# Patient Record
Sex: Male | Born: 1985 | Race: Black or African American | Hispanic: No | Marital: Single | Smoking: Never smoker
Health system: Southern US, Community
[De-identification: ages and names within clinical notes are randomized; demographics above are authoritative.]

## PROBLEM LIST (undated history)

## (undated) DIAGNOSIS — E669 Obesity, unspecified: Secondary | ICD-10-CM

---

## 2006-09-23 ENCOUNTER — Emergency Department (HOSPITAL_COMMUNITY): Admission: EM | Admit: 2006-09-23 | Discharge: 2006-09-23 | Payer: Self-pay | Admitting: Emergency Medicine

## 2006-09-25 ENCOUNTER — Emergency Department (HOSPITAL_COMMUNITY): Admission: EM | Admit: 2006-09-25 | Discharge: 2006-09-25 | Payer: Self-pay | Admitting: Emergency Medicine

## 2011-06-15 ENCOUNTER — Inpatient Hospital Stay (INDEPENDENT_AMBULATORY_CARE_PROVIDER_SITE_OTHER)
Admission: RE | Admit: 2011-06-15 | Discharge: 2011-06-15 | Disposition: A | Discharge status: Home / Self Care | Payer: Self-pay | Source: Ambulatory Visit | Attending: Emergency Medicine | Admitting: Emergency Medicine

## 2011-06-15 ENCOUNTER — Emergency Department (HOSPITAL_COMMUNITY)
Admission: EM | Admit: 2011-06-15 | Discharge: 2011-06-16 | Disposition: A | Discharge status: Home / Self Care | Payer: Self-pay | Attending: Emergency Medicine | Admitting: Emergency Medicine

## 2011-06-15 DIAGNOSIS — I200 Unstable angina: Secondary | ICD-10-CM

## 2011-06-15 DIAGNOSIS — J40 Bronchitis, not specified as acute or chronic: Secondary | ICD-10-CM | POA: Insufficient documentation

## 2011-06-15 DIAGNOSIS — R059 Cough, unspecified: Secondary | ICD-10-CM | POA: Insufficient documentation

## 2011-06-15 DIAGNOSIS — R072 Precordial pain: Secondary | ICD-10-CM | POA: Insufficient documentation

## 2011-06-15 DIAGNOSIS — R05 Cough: Secondary | ICD-10-CM | POA: Insufficient documentation

## 2011-06-16 ENCOUNTER — Emergency Department (HOSPITAL_COMMUNITY): Payer: Self-pay

## 2015-03-22 ENCOUNTER — Emergency Department (HOSPITAL_COMMUNITY)
Admission: EM | Admit: 2015-03-22 | Discharge: 2015-03-22 | Disposition: A | Discharge status: Home / Self Care | Payer: Self-pay | Attending: Emergency Medicine | Admitting: Emergency Medicine

## 2015-03-22 ENCOUNTER — Encounter (HOSPITAL_COMMUNITY): Payer: Self-pay | Admitting: Emergency Medicine

## 2015-03-22 DIAGNOSIS — K0889 Other specified disorders of teeth and supporting structures: Secondary | ICD-10-CM

## 2015-03-22 DIAGNOSIS — K088 Other specified disorders of teeth and supporting structures: Secondary | ICD-10-CM | POA: Insufficient documentation

## 2015-03-22 DIAGNOSIS — K029 Dental caries, unspecified: Secondary | ICD-10-CM | POA: Insufficient documentation

## 2015-03-22 DIAGNOSIS — K002 Abnormalities of size and form of teeth: Secondary | ICD-10-CM | POA: Insufficient documentation

## 2015-03-22 MED ORDER — LIDOCAINE VISCOUS 2 % MT SOLN
15.0000 mL | Freq: Once | OROMUCOSAL | Status: AC
  Administered 2015-03-22: 15 mL via OROMUCOSAL
  Filled 2015-03-22: qty 15

## 2015-03-22 MED ORDER — AMOXICILLIN 500 MG PO CAPS: 500.0000 mg | ORAL_CAPSULE | Freq: Three times a day (TID) | ORAL | Status: AC

## 2015-03-22 MED ORDER — LIDOCAINE VISCOUS 2 % MT SOLN: 20.0000 mL | OROMUCOSAL | Status: AC | PRN

## 2015-03-22 MED ORDER — TRAMADOL-ACETAMINOPHEN 37.5-325 MG PO TABS: 1.0000 | ORAL_TABLET | Freq: Four times a day (QID) | ORAL | Status: AC | PRN

## 2015-03-22 NOTE — ED Provider Notes (Signed)
CSN: [id number]     Arrival date & time 03/22/15  0341 History   None    Chief Complaint  Patient presents with  . Dental Pain     (Consider location/radiation/quality/duration/timing/severity/associated sxs/prior Treatment) HPI Comments: Pt arrives with L upper jaw/dental pain ongoing for the last two days, states it's only when he lays down. 9/10 when laying down.  Patient is a 29 y.o. male presenting with tooth pain.  Dental Pain Location:  Upper Quality:  Throbbing Severity:  Moderate Onset quality:  Sudden Duration:  2 days Timing:  Constant Progression:  Worsening Chronicity:  New Context: poor dentition   Context: not abscess, not dental fracture, not recent dental surgery and not trauma   Relieved by:  Nothing Worsened by:  Touching Associated symptoms: no facial pain, no facial swelling, no fever and no gum swelling   Risk factors: lack of dental care   Risk factors: no diabetes     History reviewed. No pertinent past medical history. History reviewed. No pertinent past surgical history. No family history on file. History  Substance Use Topics  . Smoking status: Never Smoker   . Smokeless tobacco: Not on file  . Alcohol Use: No    Review of Systems  Constitutional: Negative for fever.  HENT: Positive for dental problem. Negative for facial swelling.   All other systems reviewed and are negative.     Allergies  Review of patient's allergies indicates no known allergies.  Home Medications   Prior to Admission medications   Medication Sig Start Date End Date Taking? Authorizing Provider  amoxicillin (AMOXIL) 500 MG capsule Take 1 capsule (500 mg total) by mouth 3 (three) times daily. 03/22/15   Jarissa Sheriff, PA-C  lidocaine (XYLOCAINE) 2 % solution Use as directed 20 mLs in the mouth or throat as needed for mouth pain. 03/22/15   Sada Mazzoni, PA-C  traMADol-acetaminophen (ULTRACET) 37.5-325 MG per tablet Take 1 tablet by mouth every 6  (six) hours as needed. 03/22/15   Laiana Fratus, PA-C   BP 158/86 mmHg  Pulse 92  Temp(Src) 98.1 F (36.7 C)  Resp 16  Ht 6\' 1"  (1.854 m)  Wt 305 lb (138.347 kg)  BMI 40.25 kg/m2  SpO2 100% Physical Exam  Constitutional: He is oriented to person, place, and time. He appears well-developed and well-nourished. No distress.  HENT:  Head: Normocephalic and atraumatic.  Right Ear: External ear normal.  Left Ear: External ear normal.  Nose: Nose normal.  Mouth/Throat: Uvula is midline, oropharynx is clear and moist and mucous membranes are normal. No oral lesions. No trismus in the jaw. Abnormal dentition. Dental caries present. No dental abscesses, uvula swelling or lacerations.    Submental and sublingual spaces are soft.  Eyes: Conjunctivae are normal.  Neck: Normal range of motion. Neck supple.  Cardiovascular: Normal rate, regular rhythm and normal heart sounds.   Pulmonary/Chest: Effort normal and breath sounds normal. No respiratory distress.  Abdominal: There is no tenderness.  Neurological: He is alert and oriented to person, place, and time.  Skin: Skin is warm and dry. He is not diaphoretic.  Psychiatric: He has a normal mood and affect.  Nursing note and vitals reviewed.   ED Course  Procedures (including critical care time) Medications  lidocaine (XYLOCAINE) 2 % viscous mouth solution 15 mL (15 mLs Mouth/Throat Given 03/22/15 0417)    Labs Review Labs Reviewed - No data to display  Imaging Review No results found.   EKG Interpretation None  MDM   Final diagnoses:  Pain, dental    Filed Vitals:   03/22/15 0400  BP: 158/86  Pulse: 92  Temp:   Resp: 16   Afebrile, NAD, non-toxic appearing, AAOx4.  Patient with toothache.  No gross abscess.  Exam unconcerning for Ludwig's angina or spread of infection.  Will treat with Amoxil and pain medicine.  Urged patient to follow-up with dentist.  Advised PCP recheck of BP. No signs of HTN emergency or  urgency. Return precautions discussed. Patient is agreeable to plan. Patient is stable at time of discharge      Francee PiccoloJennifer Kemet Nijjar, PA-C 03/22/15 [id number]  Loren Raceravid Yelverton, MD 03/22/15 [phone]

## 2015-03-22 NOTE — ED Notes (Signed)
Pt. Left with all belongings and refused wheelchair 

## 2015-03-22 NOTE — ED Notes (Signed)
Pt arrives with L upper jaw/dental pain ongoing for the last two days, states it's only when he lays down. 9/10 when laying down. Ibuprofen effective when sitting up. No dentist.

## 2015-03-22 NOTE — Discharge Instructions (Signed)
Please follow up with your primary care physician in 1-2 days. If you do not have one please call the [facility] number listed above. Please follow up with Dr. Leanord AsalFarless to schedule a follow up appointment.  Please take your antibiotic until completion. Please take pain medication and/or muscle relaxants as prescribed and as needed for pain. Please do not drive on narcotic pain medication or on muscle relaxants. Please read all discharge instructions and return precautions.    Dental Pain A tooth ache may be caused by cavities (tooth decay). Cavities expose the nerve of the tooth to air and hot or cold temperatures. It may come from an infection or abscess (also called a boil or furuncle) around your tooth. It is also often caused by dental caries (tooth decay). This causes the pain you are having. DIAGNOSIS  Your caregiver can diagnose this problem by exam. TREATMENT   If caused by an infection, it may be treated with medications which kill germs (antibiotics) and pain medications as prescribed by your caregiver. Take medications as directed.  Only take over-the-counter or prescription medicines for pain, discomfort, or fever as directed by your caregiver.  Whether the tooth ache today is caused by infection or dental disease, you should see your dentist as soon as possible for further care. SEEK MEDICAL CARE IF: The exam and treatment you received today has been provided on an emergency basis only. This is not a substitute for complete medical or dental care. If your problem worsens or new problems (symptoms) appear, and you are unable to meet with your dentist, call or return to this location. SEEK IMMEDIATE MEDICAL CARE IF:   You have a fever.  You develop redness and swelling of your face, jaw, or neck.  You are unable to open your mouth.  You have severe pain uncontrolled by pain medicine. MAKE SURE YOU:   Understand these instructions.  Will watch your  condition.  Will get help right away if you are not doing well or get worse. Document Released: 10/26/2005 Document Revised: 01/18/2012 Document Reviewed: 06/13/2008 [facility] Patient Information 2015 [place] [place]. This information is not intended to replace advice given to you by your health care provider. Make sure you discuss any questions you have with your health care provider.

## 2017-05-26 ENCOUNTER — Emergency Department (HOSPITAL_COMMUNITY): Payer: Self-pay

## 2017-05-26 ENCOUNTER — Ambulatory Visit (HOSPITAL_COMMUNITY)
Admission: EM | Admit: 2017-05-26 | Discharge: 2017-05-26 | Disposition: A | Discharge status: Home / Self Care | Payer: Self-pay

## 2017-05-26 ENCOUNTER — Encounter (HOSPITAL_COMMUNITY): Payer: Self-pay | Admitting: *Deleted

## 2017-05-26 ENCOUNTER — Emergency Department (HOSPITAL_COMMUNITY)
Admission: EM | Admit: 2017-05-26 | Discharge: 2017-05-26 | Disposition: A | Discharge status: Home / Self Care | Payer: Self-pay | Attending: Emergency Medicine | Admitting: Emergency Medicine

## 2017-05-26 DIAGNOSIS — R42 Dizziness and giddiness: Secondary | ICD-10-CM | POA: Insufficient documentation

## 2017-05-26 DIAGNOSIS — R0789 Other chest pain: Secondary | ICD-10-CM | POA: Insufficient documentation

## 2017-05-26 HISTORY — DX: Obesity, unspecified: E66.9

## 2017-05-26 LAB — CBC
HCT: 45.4 % (ref 39.0–52.0)
Hemoglobin: 14.6 g/dL (ref 13.0–17.0)
MCH: 26.8 pg (ref 26.0–34.0)
MCHC: 32.2 g/dL (ref 30.0–36.0)
MCV: 83.3 fL (ref 78.0–100.0)
Platelets: 197 10*3/uL (ref 150–400)
RBC: 5.45 MIL/uL (ref 4.22–5.81)
RDW: 14.6 % (ref 11.5–15.5)
WBC: 7.6 10*3/uL (ref 4.0–10.5)

## 2017-05-26 LAB — I-STAT TROPONIN, ED: Troponin i, poc: 0 ng/mL (ref 0.00–0.08)

## 2017-05-26 LAB — BASIC METABOLIC PANEL
Anion gap: 6 (ref 5–15)
BUN: 13 mg/dL (ref 6–20)
CO2: 25 mmol/L (ref 22–32)
Calcium: 8.9 mg/dL (ref 8.9–10.3)
Chloride: 106 mmol/L (ref 101–111)
Creatinine, Ser: 1.19 mg/dL (ref 0.61–1.24)
GFR calc Af Amer: >60 mL/min (ref >60)
GFR calc non Af Amer: >60 mL/min (ref >60)
Glucose, Bld: 117 mg/dL — ABNORMAL HIGH (ref 65–99)
Potassium: 4.3 mmol/L (ref 3.5–5.1)
Sodium: 137 mmol/L (ref 135–145)

## 2017-05-26 NOTE — ED Provider Notes (Signed)
[facility]-EMERGENCY DEPT Provider Note   CSN: [id number] Arrival date & time: 05/26/17  1224     History   Chief Complaint Chief Complaint  Patient presents with  . Chest Pain    HPI Austin Ellison is a 31 y.o. male.  HPI   31 year old male presents today with complaints of chest pain.  Patient reports symptoms started yesterday in the left side of his chest.  He reports this was a sharp sensation that started without provocation.  Patient notes this lasted approximately 20 minutes and resolved on its own.  He reports another episode that lasts approximately 5 minutes again resolving without intervention.  He reports symptoms were not made worse with deep inspiration or movement.  He denies any preceding physical activity.  He denies any associated shortness of breath.  He reports he was slightly dizzy when the pain was ongoing.  He denies any ingestion or abdominal pain.  He denies any significant risk factors of PE or DVT.  He denies any significant personal cardiac history, reporting that his grandmother had an MI in her late 55s.  Patient is a non-smoker, denies any drug use, very little caffeine intake.  Patient denies any upper respiratory complaints, or any infectious etiology.   Past Medical History:  Diagnosis Date  . Obesity     There are no active problems to display for this patient.   History reviewed. No pertinent surgical history.     Home Medications    Prior to Admission medications   Medication Sig Start Date End Date Taking? Authorizing Provider  amoxicillin (AMOXIL) 500 MG capsule Take 1 capsule (500 mg total) by mouth 3 (three) times daily. 03/22/15   Piepenbrink, Victorino Dike, PA-C  lidocaine (XYLOCAINE) 2 % solution Use as directed 20 mLs in the mouth or throat as needed for mouth pain. 03/22/15   Piepenbrink, Victorino Dike, PA-C  traMADol-acetaminophen (ULTRACET) 37.5-325 MG per tablet Take 1 tablet by mouth every 6 (six) hours as needed. 03/22/15   Piepenbrink,  Victorino Dike, PA-C    Family History History reviewed. No pertinent family history.  Social History Social History  Substance Use Topics  . Smoking status: Never Smoker  . Smokeless tobacco: Not on file  . Alcohol use No     Allergies   Patient has no known allergies.   Review of Systems Review of Systems  All other systems reviewed and are negative.    Physical Exam Updated Vital Signs BP (!) 162/113   Pulse 82   Temp 98.4 F (36.9 C) (Oral)   Resp 18   Ht 6' (1.829 m)   Wt 133.8 kg (295 lb)   SpO2 98%   BMI 40.01 kg/m   Physical Exam  Constitutional: He is oriented to person, place, and time. He appears well-developed and well-nourished.  HENT:  Head: Normocephalic and atraumatic.  Eyes: Pupils are equal, round, and reactive to light. Conjunctivae are normal. Right eye exhibits no discharge. Left eye exhibits no discharge. No scleral icterus.  Neck: Normal range of motion. No JVD present. No tracheal deviation present.  Cardiovascular: Normal rate, regular rhythm, normal heart sounds and intact distal pulses.  Exam reveals no gallop and no friction rub.   No murmur heard. Pulmonary/Chest: Effort normal. No stridor. He exhibits tenderness.  Minor tenderness of the left anterior chest wall  Abdominal: Soft. He exhibits no distension and no mass. There is no tenderness. There is no rebound and no guarding.  Musculoskeletal: He exhibits no edema.  Neurological: He is  alert and oriented to person, place, and time. Coordination normal.  Psychiatric: He has a normal mood and affect. His behavior is normal. Judgment and thought content normal.  Nursing note and vitals reviewed.   ED Treatments / Results  Labs (all labs ordered are listed, but only abnormal results are displayed) Labs Reviewed  BASIC METABOLIC PANEL - Abnormal; Notable for the following:       Result Value   Glucose, Bld 117 (*)    All other components within normal limits  CBC  I-STAT TROPOININ,  ED    EKG  EKG Interpretation None       Radiology Dg Chest 2 View  Result Date: 05/26/2017 CLINICAL DATA:  Left-sided chest pain beginning yesterday. EXAM: CHEST  2 VIEW COMPARISON:  Two-view chest x-ray 06/16/2011 FINDINGS: The heart size is normal. The lungs are clear. There is no edema or effusion. The visualized soft tissues and bony thorax are unremarkable. IMPRESSION: Negative two view chest x-ray Electronically Signed   By: Marin Robertshristopher  Mattern M.D.   On: 05/26/2017 13:01    Procedures Procedures (including critical care time)  Medications Ordered in ED Medications - No data to display   Initial Impression / Assessment and Plan / ED Course  I have reviewed the triage vital signs and the nursing notes.  Pertinent labs & imaging results that were available during my care of the patient were reviewed by me and considered in my medical decision making (see chart for details).     Final Clinical Impressions(s) / ED Diagnoses   Final diagnoses:  Chest wall pain    31 year old male presents today with complaints of chest pain.  This is most likely chest wall pain.  It is reproducible on my exam.  He has very low heart score low suspicion for ACS, PERC negative.  Patient with no other complaints here today, in no acute distress.  Patient has reassuring workup.  He will follow-up as an outpatient with his primary care provider, he will return immediately if any new or worsening signs or symptoms present.  Patient verbalized understanding and agreement to today's plan had no further questions or concerns at time discharge.  New Prescriptions New Prescriptions   No medications on file     Rosalio LoudHedges, Adriann Ballweg, PA-C 05/26/17 [address][id number]    Mccabe Gloria, PA-C 05/26/17 1505    Raeford RazorKohut, Stephen, MD 06/05/17 [phone]

## 2017-05-26 NOTE — ED Triage Notes (Signed)
Pt reports onset yesterday of left side chest pain while working. Denies sob, denies pain increasing with movement, breathing or coughing. No acute distress is noted at triage and ekg done.

## 2017-05-26 NOTE — ED Notes (Signed)
I-stat troponin resulted 0.00 per Rulon EisenmengerFelix phelebotomy

## 2017-05-26 NOTE — Discharge Instructions (Signed)
Please read attached information. If you experience any new or worsening signs or symptoms please return to the emergency room for evaluation. Please follow-up with your primary care provider or specialist as discussed. Please use medication prescribed only as directed and discontinue taking if you have any concerning signs or symptoms.   °

## 2017-08-04 ENCOUNTER — Encounter (HOSPITAL_COMMUNITY): Payer: Self-pay | Admitting: Emergency Medicine

## 2017-08-04 ENCOUNTER — Emergency Department (HOSPITAL_COMMUNITY)
Admission: EM | Admit: 2017-08-04 | Discharge: 2017-08-04 | Disposition: A | Discharge status: Home / Self Care | Payer: Self-pay | Attending: Emergency Medicine | Admitting: Emergency Medicine

## 2017-08-04 DIAGNOSIS — R51 Headache: Secondary | ICD-10-CM | POA: Insufficient documentation

## 2017-08-04 DIAGNOSIS — Z5321 Procedure and treatment not carried out due to patient leaving prior to being seen by health care provider: Secondary | ICD-10-CM | POA: Insufficient documentation

## 2017-08-04 MED ORDER — OXYCODONE-ACETAMINOPHEN 5-325 MG PO TABS
ORAL_TABLET | ORAL | Status: AC
  Filled 2017-08-04: qty 1

## 2017-08-04 MED ORDER — OXYCODONE-ACETAMINOPHEN 5-325 MG PO TABS
1.0000 | ORAL_TABLET | ORAL | Status: DC | PRN
Start: 2017-08-04 — End: 2017-08-04
  Administered 2017-08-04: 1 via ORAL

## 2017-08-04 NOTE — ED Notes (Signed)
Patient called but no answer 

## 2017-08-04 NOTE — ED Triage Notes (Signed)
Pt c/o headache that started 3-4 days ago-- "entire head throbbing" no hx of migraines. No relief with advil or OTC meds.

## 2017-10-05 ENCOUNTER — Emergency Department (HOSPITAL_COMMUNITY): Payer: Self-pay

## 2017-10-05 ENCOUNTER — Other Ambulatory Visit: Payer: Self-pay

## 2017-10-05 ENCOUNTER — Encounter (HOSPITAL_COMMUNITY): Payer: Self-pay | Admitting: Neurology

## 2017-10-05 ENCOUNTER — Emergency Department (HOSPITAL_COMMUNITY)
Admission: EM | Admit: 2017-10-05 | Discharge: 2017-10-05 | Disposition: A | Discharge status: Home / Self Care | Payer: Self-pay | Attending: Emergency Medicine | Admitting: Emergency Medicine

## 2017-10-05 DIAGNOSIS — R51 Headache: Secondary | ICD-10-CM | POA: Insufficient documentation

## 2017-10-05 DIAGNOSIS — R519 Headache, unspecified: Secondary | ICD-10-CM

## 2017-10-05 LAB — CBC WITH DIFFERENTIAL/PLATELET
BASOS ABS: 0.1 10*3/uL (ref 0.0–0.1)
Basophils Relative: 1 %
EOS PCT: 3 %
Eosinophils Absolute: 0.2 10*3/uL (ref 0.0–0.7)
HCT: 44.7 % (ref 39.0–52.0)
Hemoglobin: 14.4 g/dL (ref 13.0–17.0)
LYMPHS PCT: 37 %
Lymphs Abs: 1.9 10*3/uL (ref 0.7–4.0)
MCH: 26.9 pg (ref 26.0–34.0)
MCHC: 32.2 g/dL (ref 30.0–36.0)
MCV: 83.6 fL (ref 78.0–100.0)
MONO ABS: 0.4 10*3/uL (ref 0.1–1.0)
MONOS PCT: 7 %
Neutro Abs: 2.8 10*3/uL (ref 1.7–7.7)
Neutrophils Relative %: 52 %
PLATELETS: 193 10*3/uL (ref 150–400)
RBC: 5.35 MIL/uL (ref 4.22–5.81)
RDW: 15.1 % (ref 11.5–15.5)
WBC: 5.3 10*3/uL (ref 4.0–10.5)

## 2017-10-05 LAB — I-STAT CHEM 8, ED
BUN: 16 mg/dL (ref 6–20)
CREATININE: 1 mg/dL (ref 0.61–1.24)
Calcium, Ion: 1.17 mmol/L (ref 1.15–1.40)
Chloride: 101 mmol/L (ref 101–111)
GLUCOSE: 90 mg/dL (ref 65–99)
HCT: 46 % (ref 39.0–52.0)
HEMOGLOBIN: 15.6 g/dL (ref 13.0–17.0)
POTASSIUM: 4.2 mmol/L (ref 3.5–5.1)
Sodium: 140 mmol/L (ref 135–145)
TCO2: 30 mmol/L (ref 22–32)

## 2017-10-05 MED ORDER — ASPIRIN-ACETAMINOPHEN-CAFFEINE 250-250-65 MG PO TABS: 1.0000 | ORAL_TABLET | Freq: Four times a day (QID) | ORAL | 0 refills | Status: AC | PRN

## 2017-10-05 MED ORDER — KETOROLAC TROMETHAMINE 60 MG/2ML IM SOLN
30.0000 mg | Freq: Once | INTRAMUSCULAR | Status: AC
  Administered 2017-10-05: 30 mg via INTRAMUSCULAR
  Filled 2017-10-05: qty 2

## 2017-10-05 MED ORDER — DEXAMETHASONE SODIUM PHOSPHATE 10 MG/ML IJ SOLN
10.0000 mg | Freq: Once | INTRAMUSCULAR | Status: AC
  Administered 2017-10-05: 10 mg via INTRAMUSCULAR
  Filled 2017-10-05: qty 1

## 2017-10-05 NOTE — Discharge Instructions (Signed)
Take exerine headache for your head pain. Drink plenty of fluids. Rest. Eat healthy. Get good sleep. Follow up with [facility] wellness center if not improving. Return if fever, blurred vision, numbness weakness in extremities, any new concerning symptom.

## 2017-10-05 NOTE — ED Triage Notes (Signed)
Pt here reporting intermittent h/a x 3 days. Denies hx of h/a. Denies n/v/d. Reports some dizziness. Ambulatory to triage. Took some tylenol this morning when he got off work, no relief. 9/10 h/a at current.

## 2017-10-05 NOTE — ED Notes (Signed)
Spoke with PA, will not order labs at this time.

## 2017-10-05 NOTE — ED Notes (Signed)
Pt went to CT

## 2017-10-05 NOTE — ED Provider Notes (Signed)
[facility] Provider Note   CSN: [id number] Arrival date & time: 10/05/17  1354     History   Chief Complaint Chief Complaint  Patient presents with  . Headache    HPI Austin Ellison is a 31 y.o. male.  HPI Austin Ellison is a 31 y.o. male presents to emergency department complaining of a headache.  Patient states his headache started 3 days ago.  He reports associated dizziness.  Denies any nausea or vomiting.  No photophobia.  He states pain is all over, from his forehead all the way to the back of the head.  States nothing is making symptoms better or worse.  Onset of headache is gradual.  No numbness or weakness in extremities.  Denies any head injury.  No fever or chills.  No neck pain or stiffness.  No difficulty speaking or ambulating.  History of similar headache once in the past, did not seek any attention.  He states this headache comes and goes in severity.  It does get better with Tylenol, but it quickly comes back once it wears out.  Patient drove himself here.  Past Medical History:  Diagnosis Date  . Obesity     There are no active problems to display for this patient.   History reviewed. No pertinent surgical history.     Home Medications    Prior to Admission medications   Medication Sig Start Date End Date Taking? Authorizing Provider  amoxicillin (AMOXIL) 500 MG capsule Take 1 capsule (500 mg total) by mouth 3 (three) times daily. 03/22/15   Piepenbrink, Victorino DikeJennifer, PA-C  lidocaine (XYLOCAINE) 2 % solution Use as directed 20 mLs in the mouth or throat as needed for mouth pain. 03/22/15   Piepenbrink, Victorino DikeJennifer, PA-C  traMADol-acetaminophen (ULTRACET) 37.5-325 MG per tablet Take 1 tablet by mouth every 6 (six) hours as needed. 03/22/15   Piepenbrink, Victorino DikeJennifer, PA-C    Family History No family history on file.  Social History Social History   Tobacco Use  . Smoking status: Never Smoker  . Smokeless tobacco: Never  Used  Substance Use Topics  . Alcohol use: No  . Drug use: No     Allergies   Patient has no known allergies.   Review of Systems Review of Systems  Constitutional: Negative for chills and fever.  Respiratory: Negative for cough, chest tightness and shortness of breath.   Cardiovascular: Negative for chest pain, palpitations and leg swelling.  Gastrointestinal: Negative for abdominal distention, abdominal pain, diarrhea, nausea and vomiting.  Genitourinary: Negative for dysuria, frequency, hematuria and urgency.  Musculoskeletal: Negative for arthralgias, myalgias, neck pain and neck stiffness.  Skin: Negative for rash.  Allergic/Immunologic: Negative for immunocompromised state.  Neurological: Positive for dizziness, light-headedness and headaches. Negative for weakness and numbness.  All other systems reviewed and are negative.    Physical Exam Updated Vital Signs BP (!) 159/86 (BP Location: Right Arm)   Pulse 66   Temp 97.8 F (36.6 C) (Oral)   Resp 18   Ht 6\' 1"  (1.854 m)   Wt 133.8 kg (295 lb)   SpO2 99%   BMI 38.92 kg/m   Physical Exam  Constitutional: He is oriented to person, place, and time. He appears well-developed and well-nourished. No distress.  HENT:  Head: Normocephalic and atraumatic.  Eyes: Conjunctivae and EOM are normal. Pupils are equal, round, and reactive to light.  Neck: Normal range of motion. Neck supple.  Cardiovascular: Normal rate, regular rhythm and normal heart  sounds.  Pulmonary/Chest: Effort normal. No respiratory distress. He has no wheezes. He has no rales.  Abdominal: Soft. Bowel sounds are normal. He exhibits no distension. There is no tenderness. There is no rebound.  Musculoskeletal: He exhibits no edema.  Neurological: He is alert and oriented to person, place, and time. He has normal strength.  5/5 and equal upper and lower extremity strength bilaterally. Equal grip strength bilaterally. Normal finger to nose and heel to shin.  No pronator drift.   Skin: Skin is warm and dry.  Nursing note and vitals reviewed.    ED Treatments / Results  Labs (all labs ordered are listed, but only abnormal results are displayed) Labs Reviewed  CBC WITH DIFFERENTIAL/PLATELET  I-STAT CHEM 8, ED    EKG  EKG Interpretation None       Radiology Ct Head Wo Contrast  Result Date: 10/05/2017 CLINICAL DATA:  Initial evaluation for acute headache for 3-4 days. EXAM: CT HEAD WITHOUT CONTRAST TECHNIQUE: Contiguous axial images were obtained from the base of the skull through the vertex without intravenous contrast. COMPARISON:  None. FINDINGS: Brain: Cerebral volume within normal limits for patient age. No evidence for acute intracranial hemorrhage. No findings to suggest acute large vessel territory infarct. No mass lesion, midline shift, or mass effect. Ventricles are normal in size without evidence for hydrocephalus. No extra-axial fluid collection identified. Vascular: No hyperdense vessel identified. Skull: Scalp soft tissues demonstrate no acute abnormality.Calvarium intact. Sinuses/Orbits: Globes and orbital soft tissues are within normal limits. Mild scattered mucosal thickening within the ethmoidal air cells and partially visualized maxillary sinuses. Paranasal sinuses are otherwise clear. No mastoid effusion. IMPRESSION: Normal head CT.  No acute intracranial abnormality. Electronically Signed   By: Rise MuBenjamin  McClintock M.D.   On: 10/05/2017 18:52    Procedures Procedures (including critical care time)  Medications Ordered in ED Medications  ketorolac (TORADOL) injection 30 mg (not administered)  dexamethasone (DECADRON) injection 10 mg (not administered)     Initial Impression / Assessment and Plan / ED Course  I have reviewed the triage vital signs and the nursing notes.  Pertinent labs & imaging results that were available during my care of the patient were reviewed by me and considered in my medical decision  making (see chart for details).     Pt in ED with headache for 3days. Very unsual for pt. denies trauma.  No decreased caffeine.  No nausea or vomiting.  No photophobia.  Pain is all over his head.  Reports associated dizziness.  Given unusual headache, will get CT for further evaluation.  Will check basic labs to rule out diabetes, anemia.  Will give Toradol and Decadron for pain.  Patient drove himself here.  Will reevaluate.  Patient's blood pressure slightly elevated, he denies history of hypertension.  He does admit to stressful job.  But states he has been eating, drinking, sleeping well.  He does report loud snoring at nighttime, never been checked for sleep apnea.  Vitals:   10/05/17 1427 10/05/17 1428 10/05/17 1429 10/05/17 1730  BP:   (!) 152/113 (!) 159/86  Pulse: 75   66  Resp: 18   18  Temp: 97.8 F (36.6 C)     TempSrc: Oral     SpO2: 96%   99%  Weight:  133.8 kg (295 lb)    Height:  6\' 1"  (1.854 m)       Final Clinical Impressions(s) / ED Diagnoses   Final diagnoses:  Acute nonintractable headache, unspecified headache  type    ED Discharge Orders        Ordered    aspirin-acetaminophen-caffeine [facility] MIGRAINE) [phone] MG tablet  Every 6 hours PRN     10/05/17 1919       Jaynie Crumble, Cordelia Poche 10/05/17 2126    Arby Barrette, MD 10/06/17 1553

## 2017-12-06 ENCOUNTER — Encounter: Payer: Self-pay | Admitting: Medical Oncology

## 2017-12-06 ENCOUNTER — Emergency Department
Admission: EM | Admit: 2017-12-06 | Discharge: 2017-12-06 | Disposition: A | Discharge status: Home / Self Care | Payer: Self-pay | Attending: Emergency Medicine | Admitting: Emergency Medicine

## 2017-12-06 ENCOUNTER — Emergency Department: Payer: Self-pay

## 2017-12-06 DIAGNOSIS — M1711 Unilateral primary osteoarthritis, right knee: Secondary | ICD-10-CM | POA: Insufficient documentation

## 2017-12-06 MED ORDER — MELOXICAM 15 MG PO TABS: 15.0000 mg | ORAL_TABLET | Freq: Every day | ORAL | 0 refills | Status: DC

## 2017-12-06 NOTE — ED Triage Notes (Signed)
Pt ambulatory to triage with reports of rt knee pain x 2 weeks without injury.

## 2017-12-06 NOTE — ED Notes (Signed)
See triage note  Presents with pain to right knee for about 2 weeks denies any injury  Now having some "numbness" to lateral aspect on knee  Ambulates well to treatment room

## 2017-12-06 NOTE — ED Triage Notes (Addendum)
FIRST NURSE NOTE-right knee pain, unsure if injured. Ambulatory. Sx X 2-3 weeks.

## 2017-12-06 NOTE — ED Provider Notes (Signed)
[facility] Emergency Department Provider Note   ____________________________________________   First MD Initiated Contact with Patient 12/06/17 1151     (approximate)  I have reviewed the triage vital signs and the nursing notes.   HISTORY  Chief Complaint Knee Pain    HPI Austin Ellison is a 32 y.o. male increasing right knee pain for 2 weeks.  No history of trauma.  Patient is morbid obese.  Patient points to the lateral aspect of his knee as a source of pain.  Patient states pain increases with flexion and weightbearing. Patient rates pain as a 6/10.  Patient described the pain is "achy".  No palates measured for complaint. Past Medical History:  Diagnosis Date  . Obesity     There are no active problems to display for this patient.   History reviewed. No pertinent surgical history.  Prior to Admission medications   Medication Sig Start Date End Date Taking? Authorizing Provider  amoxicillin (AMOXIL) 500 MG capsule Take 1 capsule (500 mg total) by mouth 3 (three) times daily. 03/22/15   Piepenbrink, Victorino Dike, PA-C  aspirin-acetaminophen-caffeine (EXCEDRIN MIGRAINE) [phone] MG tablet Take 1 tablet by mouth every 6 (six) hours as needed for headache. 10/05/17   Kirichenko, Tatyana, PA-C  lidocaine (XYLOCAINE) 2 % solution Use as directed 20 mLs in the mouth or throat as needed for mouth pain. 03/22/15   Piepenbrink, Victorino Dike, PA-C  meloxicam (MOBIC) 15 MG tablet Take 1 tablet (15 mg total) by mouth daily. 12/06/17   Joni Reining, PA-C  traMADol-acetaminophen (ULTRACET) 37.5-325 MG per tablet Take 1 tablet by mouth every 6 (six) hours as needed. 03/22/15   Piepenbrink, Victorino Dike, PA-C    Allergies Patient has no known allergies.  No family history on file.  Social History Social History   Tobacco Use  . Smoking status: Never Smoker  . Smokeless tobacco: Never Used  Substance Use Topics  . Alcohol use: No  . Drug use: No    Review of  Systems Constitutional: No fever/chills Eyes: No visual changes. ENT: No sore throat. Cardiovascular: Denies chest pain. Respiratory: Denies shortness of breath. Gastrointestinal: No abdominal pain.  No nausea, no vomiting.  No diarrhea.  No constipation. Genitourinary: Negative for dysuria. Musculoskeletal: Right knee pain Skin: Negative for rash. Neurological: Negative for headaches, focal weakness or numbness.   ____________________________________________   PHYSICAL EXAM:  VITAL SIGNS: ED Triage Vitals  Enc Vitals Group     BP 12/06/17 1137 (!) 160/111     Pulse Rate 12/06/17 1137 77     Resp 12/06/17 1137 18     Temp 12/06/17 1137 98.5 F (36.9 C)     Temp Source 12/06/17 1137 Oral     SpO2 12/06/17 1137 99 %     Weight 12/06/17 1138 295 lb (133.8 kg)     Height 12/06/17 1138 6\' 1"  (1.854 m)     Head Circumference --      Peak Flow --      Pain Score 12/06/17 1138 6     Pain Loc --      Pain Edu? --      Excl. in GC? --    Constitutional: Alert and oriented. Well appearing and in no acute distress.  Obesity Cardiovascular: Normal rate, regular rhythm. Grossly normal heart sounds.  Good peripheral circulation. Respiratory: Normal respiratory effort.  No retractions. Lungs CTAB. Neurologic:  Normal speech and language. No gross focal neurologic deficits are appreciated. No gait instability. Skin:  Skin  is warm, dry and intact. No rash noted. Psychiatric: Mood and affect are normal. Speech and behavior are normal.  ____________________________________________   LABS (all labs ordered are listed, but only abnormal results are displayed)  Labs Reviewed - No data to display ____________________________________________  EKG   ____________________________________________  RADIOLOGY  Dg Knee Complete 4 Views Right  Result Date: 12/06/2017 CLINICAL DATA:  32 year old male with right knee pain for 2 weeks. No injury. Initial encounter. EXAM: RIGHT KNEE -  COMPLETE 4+ VIEW COMPARISON:  None. FINDINGS: Very mild medial tibiofemoral joint space and patellofemoral joint degenerative changes. No fracture or dislocation. Visualized soft tissues unremarkable. IMPRESSION: Very mild medial tibiofemoral joint space and patellofemoral joint degenerative changes. Electronically Signed   By: Lacy DuverneySteven  Olson M.D.   On: 12/06/2017 12:27    ____________________________________________   PROCEDURES  Procedure(s) performed: None  Procedures  Critical Care performed: No  ____________________________________________   INITIAL IMPRESSION / ASSESSMENT AND PLAN / ED COURSE  As part of my medical decision making, I reviewed the following data within the electronic MEDICAL RECORD NUMBER    Right knee pain secondary to osteoarthritis and morbid obesity.  Discussed x-ray findings with patient.  Patient given discharge care instruction.  Patient can return to work note.  Patient advised take medication as directed and [facility].      ____________________________________________   FINAL CLINICAL IMPRESSION(S) / ED DIAGNOSES  Final diagnoses:  Primary osteoarthritis of right knee     ED Discharge Orders        Ordered    meloxicam (MOBIC) 15 MG tablet  Daily     12/06/17 1256       Note:  This document was prepared using Dragon voice recognition software and may include unintentional dictation errors.    Joni ReiningSmith, Ronald K, PA-C 12/06/17 1258    Loleta RoseForbach, Cory, MD 12/06/17 1320

## 2018-03-22 ENCOUNTER — Other Ambulatory Visit: Payer: Self-pay

## 2018-03-22 ENCOUNTER — Emergency Department
Admission: EM | Admit: 2018-03-22 | Discharge: 2018-03-22 | Disposition: A | Discharge status: Home / Self Care | Payer: Self-pay | Attending: Emergency Medicine | Admitting: Emergency Medicine

## 2018-03-22 ENCOUNTER — Encounter: Payer: Self-pay | Admitting: Emergency Medicine

## 2018-03-22 DIAGNOSIS — R519 Headache, unspecified: Secondary | ICD-10-CM

## 2018-03-22 DIAGNOSIS — Z79899 Other long term (current) drug therapy: Secondary | ICD-10-CM | POA: Insufficient documentation

## 2018-03-22 DIAGNOSIS — R51 Headache: Secondary | ICD-10-CM | POA: Insufficient documentation

## 2018-03-22 MED ORDER — KETOROLAC TROMETHAMINE 30 MG/ML IJ SOLN
15.0000 mg | Freq: Once | INTRAMUSCULAR | Status: AC
  Administered 2018-03-22: 15 mg via INTRAMUSCULAR
  Filled 2018-03-22: qty 1

## 2018-03-22 MED ORDER — PROCHLORPERAZINE EDISYLATE 10 MG/2ML IJ SOLN
10.0000 mg | Freq: Once | INTRAMUSCULAR | Status: AC
  Administered 2018-03-22: 10 mg via INTRAMUSCULAR
  Filled 2018-03-22: qty 2

## 2018-03-22 NOTE — Discharge Instructions (Signed)
Your blood pressure was somewhat elevated here, this is likely because you are in pain but we do require that you please follow closely with primary care or the primary care doctor listed above for recheck in the next day or 2.  He had chest pain shortness of breath increased headache headache that is different from normal, vomiting, fever, numbness, weakness, or you feel worse in any way please return to the emergency department.  Continue taking your migraine medication at home as needed, but do follow closely with neurology.

## 2018-03-22 NOTE — ED Notes (Signed)
Pt has no new neuro defects and reports feeling that the headache has subsided enough to feel comfortable. Pt in NAD. Pt verbalized understanding that BP needs to be treated.

## 2018-03-22 NOTE — ED Provider Notes (Addendum)
[facility] Emergency Department Provider Note  ____________________________________________   I have reviewed the triage vital signs and the nursing notes. Where available I have reviewed prior notes and, if possible and indicated, outside hospital notes.    HISTORY  Chief Complaint No chief complaint on file.    HPI Austin Ellison is a 32 y.o. male history of headaches usually associated but not always associated with spots prior to the abdomen of the headache, presents today with a headache.  Not worse headache of life gradual onset, same headache he always has.  He does have Excedrin Migraine at home but he has not taken it.  He does have meloxicam and which he has not taken either.  He states that the only thing he is tried for this particular headache is Motrin.  Motrin has not seemed to help yet.  Not the worst headache of life gradual onset no focal neurologic symptoms, positive mild photophobia.  Diffuse mild headache at the same headache he always gets no fever no stiff neck.  Makes it better nothing makes it worse no other complaints     Past Medical History:  Diagnosis Date  . Obesity     There are no active problems to display for this patient.   History reviewed. No pertinent surgical history.  Prior to Admission medications   Medication Sig Start Date End Date Taking? Authorizing Provider  amoxicillin (AMOXIL) 500 MG capsule Take 1 capsule (500 mg total) by mouth 3 (three) times daily. 03/22/15   Piepenbrink, Victorino Dike, PA-C  aspirin-acetaminophen-caffeine (EXCEDRIN MIGRAINE) [phone] MG tablet Take 1 tablet by mouth every 6 (six) hours as needed for headache. 10/05/17   Kirichenko, Tatyana, PA-C  lidocaine (XYLOCAINE) 2 % solution Use as directed 20 mLs in the mouth or throat as needed for mouth pain. 03/22/15   Piepenbrink, Victorino Dike, PA-C  meloxicam (MOBIC) 15 MG tablet Take 1 tablet (15 mg total) by mouth daily. 12/06/17   Joni Reining,  PA-C  traMADol-acetaminophen (ULTRACET) 37.5-325 MG per tablet Take 1 tablet by mouth every 6 (six) hours as needed. 03/22/15   Piepenbrink, Victorino Dike, PA-C    Allergies Patient has no known allergies.  No family history on file.  Social History Social History   Tobacco Use  . Smoking status: Never Smoker  . Smokeless tobacco: Never Used  Substance Use Topics  . Alcohol use: No  . Drug use: No    Review of Systems Constitutional: No fever/chills Eyes: No visual changes. ENT: No sore throat. No stiff neck no neck pain Cardiovascular: Denies chest pain. Respiratory: Denies shortness of breath. Gastrointestinal:   no vomiting.  No diarrhea.  No constipation. Genitourinary: Negative for dysuria. Musculoskeletal: Negative lower extremity swelling Skin: Negative for rash. Neurological: Negative for atypical headaches, focal weakness or numbness.   ____________________________________________   PHYSICAL EXAM:  VITAL SIGNS: ED Triage Vitals  Enc Vitals Group     BP 03/22/18 1416 (!) 181/110     Pulse Rate 03/22/18 1409 80     Resp 03/22/18 1409 20     Temp 03/22/18 1409 98.2 F (36.8 C)     Temp Source 03/22/18 1409 Oral     SpO2 03/22/18 1409 98 %     Weight 03/22/18 1409 295 lb (133.8 kg)     Height 03/22/18 1409  (1.854 m)     Head Circumference --      Peak Flow --      Pain Score 03/22/18 1409 7  Pain Loc --      Pain Edu? --      Excl. in GC? --     Constitutional: Alert and oriented. Well appearing and in no acute distress. Eyes: Conjunctivae are normal Head: Atraumatic HEENT: No congestion/rhinnorhea. Mucous membranes are moist.  Oropharynx non-erythematous funduscopic exam, limited, shows no evidence of acute papilledema Neck:   Nontender with no meningismus, no masses, no stridor Cardiovascular: Normal rate, regular rhythm. Grossly normal heart sounds.  Good peripheral circulation. Respiratory: Normal respiratory effort.  No retractions. Lungs  CTAB. Abdominal: Soft and nontender. No distention. No guarding no rebound Back:  There is no focal tenderness or step off.  there is no midline tenderness there are no lesions noted. there is no CVA tenderness Musculoskeletal: No lower extremity tenderness, no upper extremity tenderness. No joint effusions, no DVT signs strong distal pulses no edema Neurologic:  Normal speech and language. No gross focal neurologic deficits are appreciated.  Skin:  Skin is warm, dry and intact. No rash noted. Psychiatric: Mood and affect are normal. Speech and behavior are normal.  ____________________________________________   LABS (all labs ordered are listed, but only abnormal results are displayed)  Labs Reviewed - No data to display  Pertinent labs  results that were available during my care of the patient were reviewed by me and considered in my medical decision making (see chart for details). ____________________________________________  EKG  I personally interpreted any EKGs ordered by me or triage Sinus rhythm rate 80 bpm no acute ST elevation or depression ossific ST changes, ____________________________________________  RADIOLOGY  Pertinent labs & imaging results that were available during my care of the patient were reviewed by me and considered in my medical decision making (see chart for details). If possible, patient and/or family made aware of any abnormal findings.  No results found. ____________________________________________    PROCEDURES  Procedure(s) performed: None  Procedures  Critical Care performed: None  ____________________________________________   INITIAL IMPRESSION / ASSESSMENT AND PLAN / ED COURSE  Pertinent labs & imaging results that were available during my care of the patient were reviewed by me and considered in my medical decision making (see chart for details).  Patient with chronic headaches presents with the same headache he always has.  He has  had these headaches he states every other month or more frequent than that sometimes every 6 weeks for the last several years.  He has had negative CT scans for this is recently has 6 months ago.  He has not yet followed up with a neurologist.  He has not taken his antimigraine medication at home which does usually help him.  This is appear to be migraine.  There is no evidence of acute head bleed or red flags to suggest other acute intracranial processes for this gradual onset recurrent years long headaches.  We will refer him to neurology will give medication see if we can feeling better here.  ----------------------------------------- 5:55 PM on 03/22/2018 -----------------------------------------  Remains neurologically intact, headache is gone would like to go home.  Blood pressure was elevated but he was in pain and I think the cuff was actually too small, we will recheck that prior to discharge I do not think this represents hypertensive urgency he is headache free at this time.  We did make patient aware of this blood pressure he states this is not an abnormal reading for him when he gets his headaches and as soon as he gets home and comes down it will  go down.  He is completely neurologically intact and has no headache at discharge.    Pt remains at neurologic baseline. At this time, there is nothing to suggest or support the diagnosis of subarachnoid hemorrhage, aneurysmal event, meningitis, tumor or mass, cavernous thrombosis, encephalitis, ischemic stroke, pseudotumor cerebri, glaucoma, temporal arteritis, or any other acute intracrania/neurological process. Extensive return precautions including but not limited to any new or worrisome symptoms such as worsening of, or change in, headache, any neurological symptoms, fever etc.. Natural disease course discussed with patient. The need for follow-up and all of my customary return precautions have been discussed as well.     ____________________________________________   FINAL CLINICAL IMPRESSION(S) / ED DIAGNOSES  Final diagnoses:  None      This chart was dictated using voice recognition software.  Despite best efforts to proofread,  errors can occur which can change meaning.      Jeanmarie Plant, MD 03/22/18 1631    Jeanmarie Plant, MD 03/22/18 1750    Jeanmarie Plant, MD 03/22/18 1755    Jeanmarie Plant, MD 03/22/18 1807    Jeanmarie Plant, MD 03/22/18 [phone]

## 2018-04-29 ENCOUNTER — Encounter (HOSPITAL_COMMUNITY): Payer: Self-pay | Admitting: *Deleted

## 2018-04-29 ENCOUNTER — Emergency Department (HOSPITAL_COMMUNITY)
Admission: EM | Admit: 2018-04-29 | Discharge: 2018-04-30 | Disposition: A | Discharge status: Home / Self Care | Payer: Self-pay | Attending: Emergency Medicine | Admitting: Emergency Medicine

## 2018-04-29 ENCOUNTER — Emergency Department (HOSPITAL_COMMUNITY): Payer: Self-pay

## 2018-04-29 ENCOUNTER — Other Ambulatory Visit: Payer: Self-pay

## 2018-04-29 DIAGNOSIS — M25532 Pain in left wrist: Secondary | ICD-10-CM | POA: Insufficient documentation

## 2018-04-29 DIAGNOSIS — R030 Elevated blood-pressure reading, without diagnosis of hypertension: Secondary | ICD-10-CM

## 2018-04-29 DIAGNOSIS — Z79899 Other long term (current) drug therapy: Secondary | ICD-10-CM | POA: Insufficient documentation

## 2018-04-29 NOTE — ED Notes (Signed)
Patient transported to X-ray 

## 2018-04-29 NOTE — ED Triage Notes (Signed)
Painful lt wrist since yesterday  No known injury.  Sl swelling  No previous hjistory

## 2018-04-30 MED ORDER — MELOXICAM 15 MG PO TABS: 15.0000 mg | ORAL_TABLET | Freq: Every day | ORAL | 0 refills | Status: AC

## 2018-04-30 NOTE — Discharge Instructions (Signed)
Please see the information and instructions below regarding your visit.  Your diagnoses today include:  1. Left wrist pain   2. Elevated blood-pressure reading without diagnosis of hypertension     Tests performed today include: See side panel of your discharge paperwork for testing performed today. Vital signs are listed at the bottom of these instructions.   X-rays normal today.  Medications prescribed:    Take any prescribed medications only as prescribed, and any over the counter medications only as directed on the packaging.  You are prescribed Mobic, a non-steroidal anti-inflammatory agent (NSAID) for pain. You may take 15 mg every 24 hours as needed for pain. If still requiring this medication around the clock for acute pain after 10 days, please see your primary healthcare provider.  Women who are pregnant, breastfeeding, or planning on becoming pregnant should not take non-steroidal anti-inflammatories such as Advil and Aleve. Tylenol is a safe over the counter pain reliever in pregnant women.  You may combine this medication with Tylenol, 650 mg every 6 hours, so you are receiving something for pain every 3 hours.  This is not a long-term medication unless under the care and direction of your primary provider. Taking this medication long-term and not under the supervision of a healthcare provider could increase the risk of stomach ulcers, kidney problems, and cardiovascular problems such as high blood pressure.    Home care instructions:  Please follow any educational materials contained in this packet.   Wear the wrist splint at all times for at least 1 week to prevent any flexion/extension of the wrist.  Ice, 20 minutes on, 20 minutes off up to 2 times a day.  Make sure he put a towel in between your skin and the ice.  Follow-up instructions: Please follow-up with your primary care provider as soon as possible for further evaluation of your symptoms if they are not  completely improved.   Return instructions:  Please return to the Emergency Department if you experience worsening symptoms.  Please return to the emergency department if you have any increase in swelling, or redness of the wrist, or fever with joint pain. Please return if you have any other emergent concerns.  Additional Information:   Your vital signs today were: BP (!) 155/108    Pulse (!) 104    Temp 98.9 F (37.2 C)    Resp 20    Ht 6\' 1"  (1.854 m)    Wt 135.2 kg (298 lb)    SpO2 97%    BMI 39.32 kg/m  If your blood pressure (BP) was elevated on multiple readings during this visit above 130 for the top number or above 80 for the bottom number, please have this repeated by your primary care provider within one month. --------------  Thank you for allowing us to participate in your care today.

## 2018-04-30 NOTE — ED Provider Notes (Signed)
[facility]ENC[facility] Provider Note   CSN: [id number] Arrival date & time: 04/29/18  1934     History   Chief Complaint Chief Complaint  Patient presents with  . Wrist Pain    HPI Bryna ColanderLeontre Haymer is a 32 y.o. male.  HPI  Patient is a 32 year old male with a history of presenting for left wrist pain.  Patient reports that over the past 2 to 3 days, he has felt acute pain on the radial aspect of his left wrist with movement, as well as "swelling" along the lateral aspect of the distal radius.  Patient denies any trauma.  Patient denies any past injury to it.  Patient denies any erythema, bogginess, fever, or chills.  Patient denies any weakness or numbness in his fingers distal to the site of pain.  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  Patient denies any treatments or anti-inflammatories for this presentation.  Past Medical History:  Diagnosis Date  . Obesity     There are no active problems to display for this patient.   History reviewed. No pertinent surgical history.      Home Medications    Prior to Admission medications   Medication Sig Start Date End Date Taking? Authorizing Provider  amoxicillin (AMOXIL) 500 MG capsule Take 1 capsule (500 mg total) by mouth 3 (three) times daily. 03/22/15   Piepenbrink, Victorino DikeJennifer, PA-C  aspirin-acetaminophen-caffeine (EXCEDRIN MIGRAINE) [phone] MG tablet Take 1 tablet by mouth every 6 (six) hours as needed for headache. 10/05/17   Kirichenko, Tatyana, PA-C  lidocaine (XYLOCAINE) 2 % solution Use as directed 20 mLs in the mouth or throat as needed for mouth pain. 03/22/15   Piepenbrink, Victorino DikeJennifer, PA-C  meloxicam (MOBIC) 15 MG tablet Take 1 tablet (15 mg total) by mouth daily. 04/30/18   Aviva KluverMurray, Quaneisha Hanisch B, PA-C  traMADol-acetaminophen (ULTRACET) 37.5-325 MG per tablet Take 1  tablet by mouth every 6 (six) hours as needed. 03/22/15   Piepenbrink, Victorino DikeJennifer, PA-C    Family History No family history on file.  Social History Social History   Tobacco Use  . Smoking status: Never Smoker  . Smokeless tobacco: Never Used  Substance Use Topics  . Alcohol use: No  . Drug use: No     Allergies   Patient has no known allergies.   Review of Systems Review of Systems  Musculoskeletal: Positive for arthralgias and joint swelling.  Skin: Negative for color change.  Neurological: Negative for weakness and numbness.     Physical Exam Updated Vital Signs BP (!) 169/98 (BP Location: Right Arm)   Pulse 72   Temp 98.9 F (37.2 C)   Resp 20   Ht 6\' 1"  (1.854 m)   Wt 135.2 kg (298 lb)   SpO2 98%   BMI 39.32 kg/m   Physical Exam  Constitutional: He appears well-developed and well-nourished. No distress.  Sitting comfortably in bed.  HENT:  Head: Normocephalic and atraumatic.  Eyes: Conjunctivae are normal. Right eye exhibits no discharge. Left eye exhibits no discharge.  EOMs normal to gross examination.  Neck: Normal range of motion.  Cardiovascular: Normal rate and regular rhythm.  Intact, 2+ radial pulse of LUE.  Pulmonary/Chest:  Normal respiratory effort. Patient converses comfortably. No audible wheeze or stridor.  Abdominal: He exhibits no distension.  Musculoskeletal:  Left wrist exam: Slight over the  extensor tendons overlying the lateral distal left radius.  No erythema or edema.  Patient has tenderness to palpation overlying the extensor tendons attaching to the radial head.  Patient has no snuffbox tenderness.  Patient has pain with passive and active range of motion of inversion, eversion, flexion, and extension of the left wrist. Full sensation in all digits of the left hand.  Full strength with grip strength.  Neurological: He is alert.  Cranial nerves intact to gross observation. Patient moves extremities without difficulty.  Skin: Skin  is warm and dry. He is not diaphoretic.  Psychiatric: He has a normal mood and affect. His behavior is normal. Judgment and thought content normal.  Nursing note and vitals reviewed.    ED Treatments / Results  Labs (all labs ordered are listed, but only abnormal results are displayed) Labs Reviewed - No data to display  EKG None  Radiology Dg Wrist Complete Left  Result Date: 04/29/2018 CLINICAL DATA:  Wrist pain EXAM: LEFT WRIST - COMPLETE 3+ VIEW COMPARISON:  None. FINDINGS: There is no evidence of fracture or dislocation. There is no evidence of arthropathy or other focal bone abnormality. Soft tissues are unremarkable. IMPRESSION: Negative. Electronically Signed   By: Jasmine Pang M.D.   On: 04/29/2018 23:26    Procedures Procedures (including critical care time)  Medications Ordered in ED Medications - No data to display   Initial Impression / Assessment and Plan / ED Course  I have reviewed the triage vital signs and the nursing notes.  Pertinent labs & imaging results that were available during my care of the patient were reviewed by me and considered in my medical decision making (see chart for details).      Patient is well-appearing and neurovascularly intact in the left upper extremity.  No erythema or edema to suggest infectious etiology.  No preceding trauma to suspect occult carpal bone injury.  Given location of patient's pain, exacerbating factors, and likely triggers of repetitive motion, suspect de Quervain's tenosynovitis.  Patient placed in a thumb spica soft splint, instructed to take anti-inflammatories, and rest his wrist from repetitive motion.  Patient given return precautions for any increasing edema, erythema, loss of range of motion of the wrist.  Patient is in understanding and agrees with the plan of care.  Elevated blood pressure was noted to patient today.  Patient discussed that he is working on establishing primary care once he renews his  insurance.    Final Clinical Impressions(s) / ED Diagnoses   Final diagnoses:  Left wrist pain  Elevated blood-pressure reading without diagnosis of hypertension    ED Discharge Orders        Ordered    meloxicam (MOBIC) 15 MG tablet  Daily     04/30/18 0019       Elisha Ponder, PA-C 04/30/18 0142    Melene Plan, DO 04/30/18 0405

## 2019-03-13 IMAGING — DX DG WRIST COMPLETE 3+V*L*
4 series · 4 of 4 positions shown · non-contrast
Comparison: None.

CLINICAL DATA: Wrist pain

EXAM:
LEFT WRIST - COMPLETE 3+ VIEW

[wrist pa]
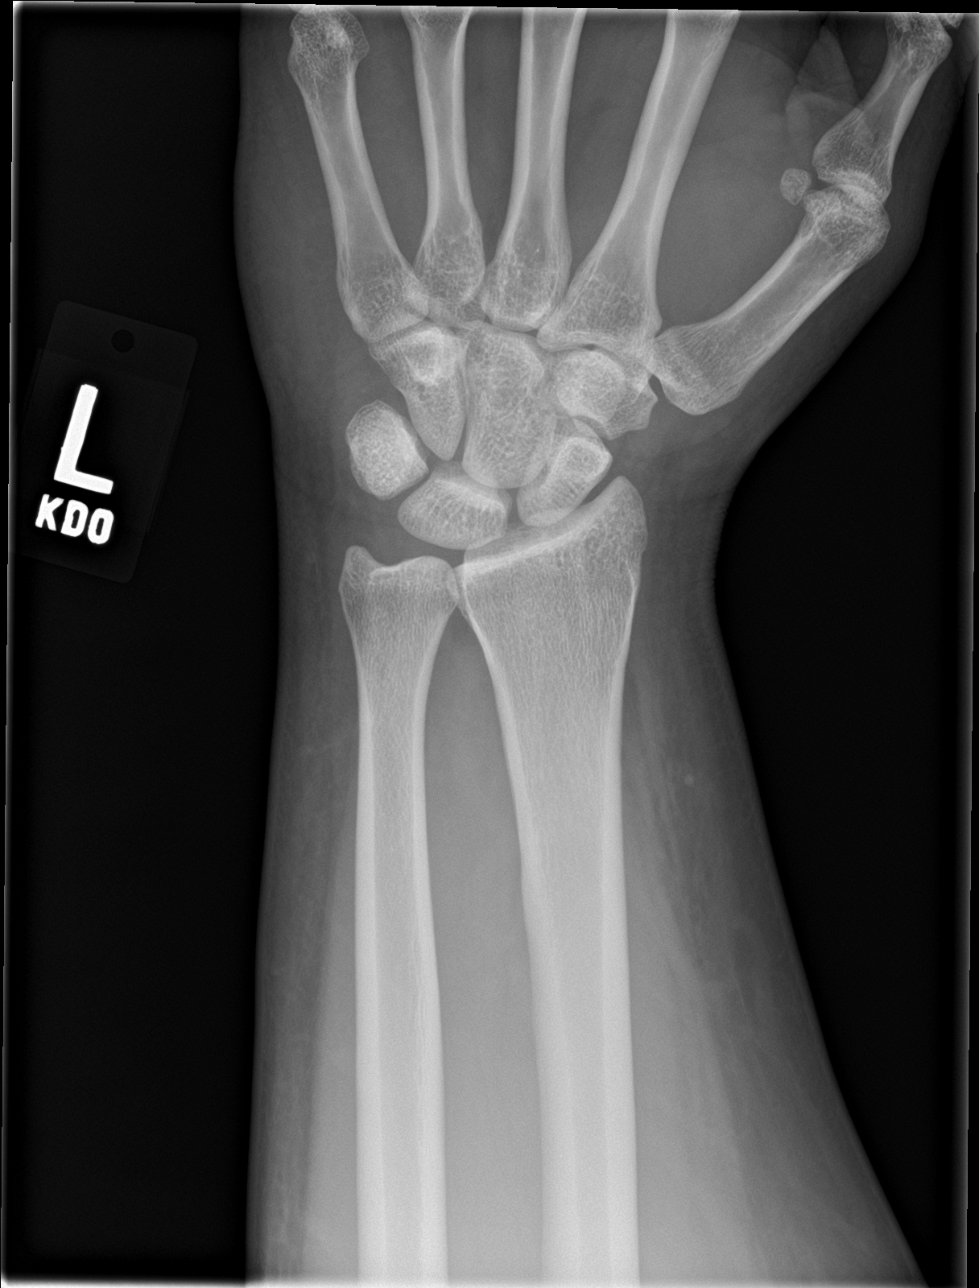

[wrist obl]
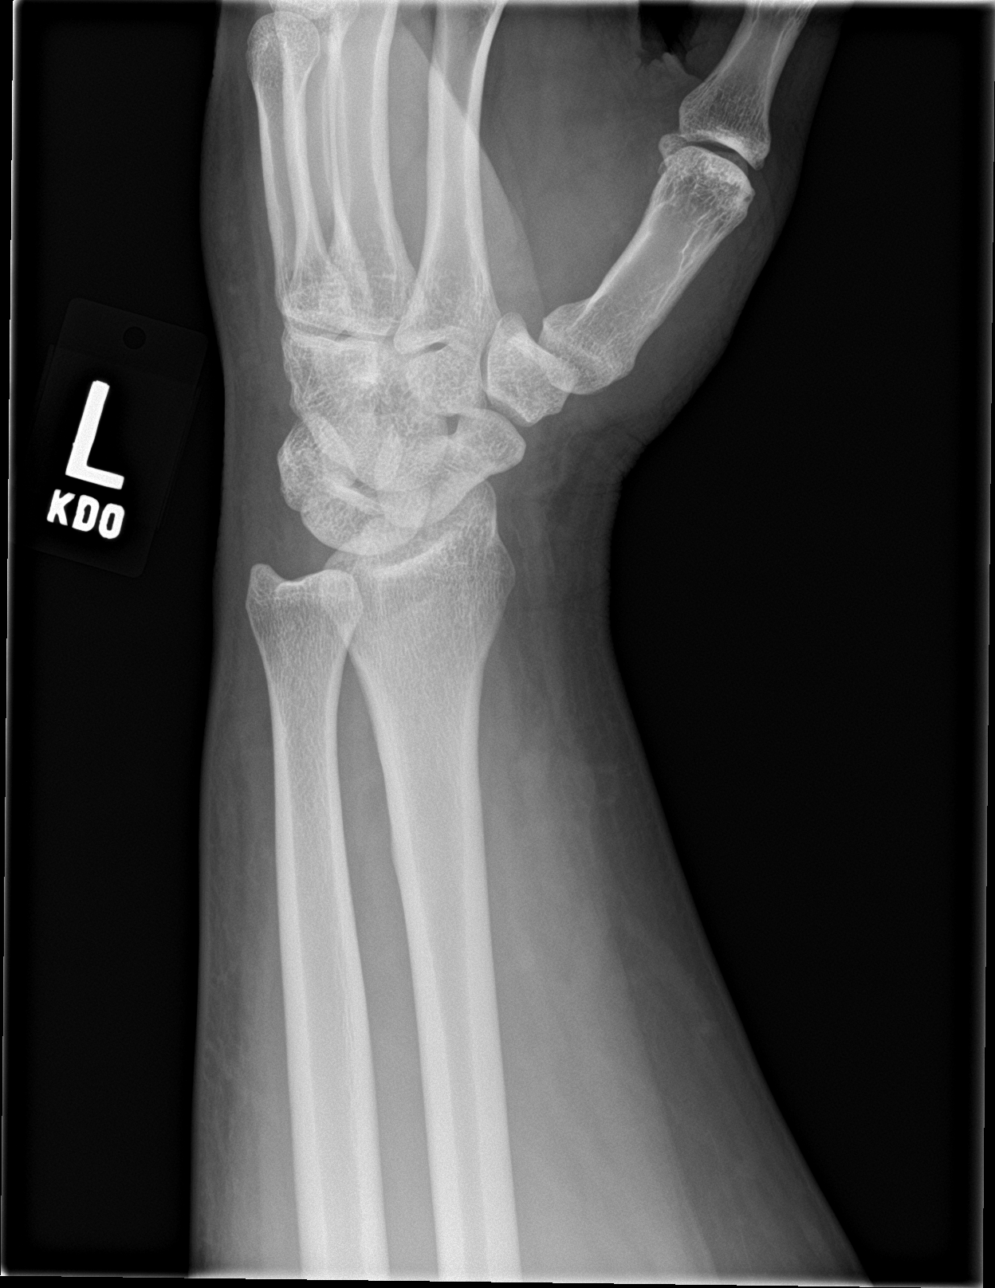

[wrist lat]
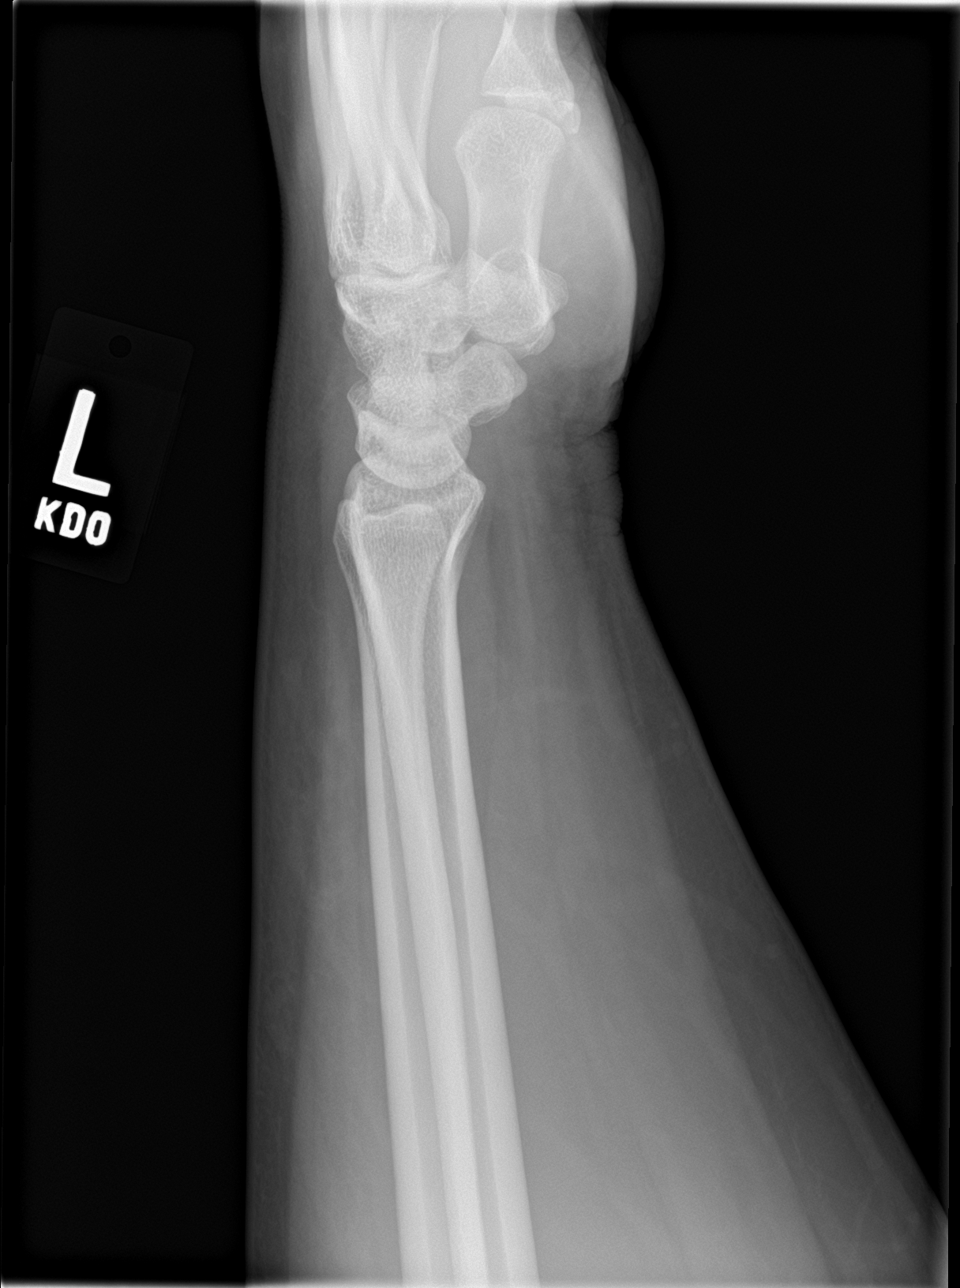

[wrist navicular]
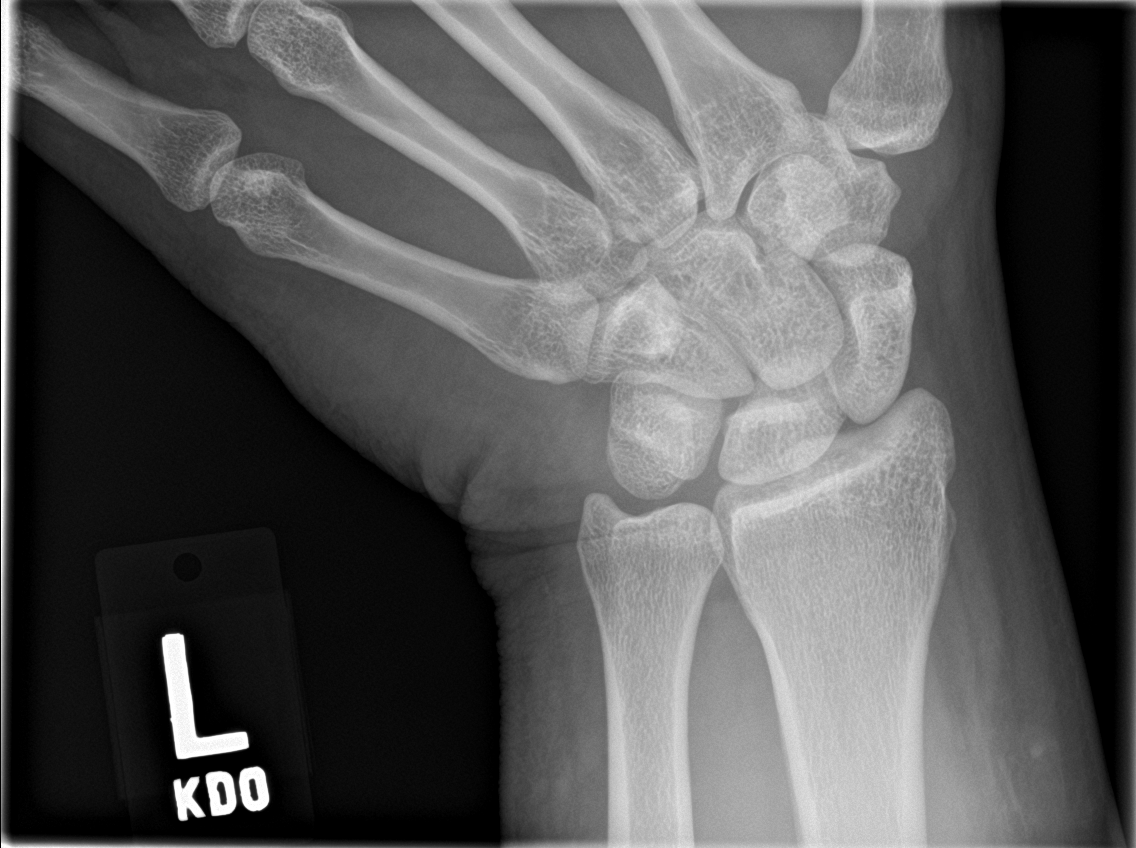

[4 of 4 positions shown; findings below may reference images not displayed]

FINDINGS: There is no evidence of fracture or dislocation. There is no
evidence of arthropathy or other focal bone abnormality. Soft
tissues are unremarkable.
IMPRESSION: Negative.
